# Patient Record
Sex: Female | Born: 1935 | Race: White | Hispanic: No | Marital: Single | Smoking: Former smoker
Health system: Southern US, Community
[De-identification: ages and names within clinical notes are randomized; demographics above are authoritative.]

## PROBLEM LIST (undated history)

## (undated) DIAGNOSIS — N390 Urinary tract infection, site not specified: Secondary | ICD-10-CM

## (undated) DIAGNOSIS — C3490 Malignant neoplasm of unspecified part of unspecified bronchus or lung: Secondary | ICD-10-CM

## (undated) DIAGNOSIS — J449 Chronic obstructive pulmonary disease, unspecified: Secondary | ICD-10-CM

## (undated) HISTORY — PX: BLADDER SUSPENSION: SHX72

## (undated) HISTORY — PX: LUNG REMOVAL, PARTIAL: SHX233

## (undated) HISTORY — PX: ABDOMINAL HYSTERECTOMY: SHX81

## (undated) HISTORY — PX: APPENDECTOMY: SHX54

## (undated) HISTORY — PX: FRACTURE SURGERY: SHX138

## (undated) HISTORY — PX: FEMUR FRACTURE SURGERY: SHX633

## (undated) HISTORY — PX: CHOLECYSTECTOMY: SHX55

---

## 2017-09-01 ENCOUNTER — Observation Stay (HOSPITAL_COMMUNITY)
Admission: EM | Admit: 2017-09-01 | Discharge: 2017-09-03 | Disposition: A | Discharge status: Home / Self Care | Payer: Medicare Other | Attending: Internal Medicine | Admitting: Internal Medicine

## 2017-09-01 ENCOUNTER — Encounter (HOSPITAL_COMMUNITY): Payer: Self-pay | Admitting: Emergency Medicine

## 2017-09-01 ENCOUNTER — Other Ambulatory Visit: Payer: Self-pay

## 2017-09-01 DIAGNOSIS — R51 Headache: Secondary | ICD-10-CM | POA: Insufficient documentation

## 2017-09-01 DIAGNOSIS — R112 Nausea with vomiting, unspecified: Secondary | ICD-10-CM | POA: Diagnosis not present

## 2017-09-01 DIAGNOSIS — Z7951 Long term (current) use of inhaled steroids: Secondary | ICD-10-CM | POA: Insufficient documentation

## 2017-09-01 DIAGNOSIS — Z79899 Other long term (current) drug therapy: Secondary | ICD-10-CM | POA: Diagnosis not present

## 2017-09-01 DIAGNOSIS — R519 Headache, unspecified: Secondary | ICD-10-CM

## 2017-09-01 DIAGNOSIS — E8770 Fluid overload, unspecified: Secondary | ICD-10-CM | POA: Diagnosis not present

## 2017-09-01 DIAGNOSIS — Z85118 Personal history of other malignant neoplasm of bronchus and lung: Secondary | ICD-10-CM

## 2017-09-01 DIAGNOSIS — J449 Chronic obstructive pulmonary disease, unspecified: Secondary | ICD-10-CM | POA: Diagnosis not present

## 2017-09-01 DIAGNOSIS — Z902 Acquired absence of lung [part of]: Secondary | ICD-10-CM | POA: Insufficient documentation

## 2017-09-01 DIAGNOSIS — K219 Gastro-esophageal reflux disease without esophagitis: Secondary | ICD-10-CM | POA: Diagnosis not present

## 2017-09-01 DIAGNOSIS — Z87891 Personal history of nicotine dependence: Secondary | ICD-10-CM | POA: Diagnosis not present

## 2017-09-01 DIAGNOSIS — D72829 Elevated white blood cell count, unspecified: Secondary | ICD-10-CM | POA: Diagnosis not present

## 2017-09-01 DIAGNOSIS — R1110 Vomiting, unspecified: Secondary | ICD-10-CM | POA: Diagnosis present

## 2017-09-01 DIAGNOSIS — R0902 Hypoxemia: Secondary | ICD-10-CM | POA: Insufficient documentation

## 2017-09-01 DIAGNOSIS — O2940 Spinal and epidural anesthesia induced headache during pregnancy, unspecified trimester: Secondary | ICD-10-CM | POA: Diagnosis present

## 2017-09-01 HISTORY — DX: Malignant neoplasm of unspecified part of unspecified bronchus or lung: C34.90

## 2017-09-01 HISTORY — DX: Chronic obstructive pulmonary disease, unspecified: J44.9

## 2017-09-01 HISTORY — DX: Urinary tract infection, site not specified: N39.0

## 2017-09-01 NOTE — ED Triage Notes (Signed)
Pt states she started vomiting around 1845 and has not been able to stop  Pt is dry heaving in triage nonstop   Pt states she had chills earlier today and is c/o headache  Pt states her stomach hurts for heaving

## 2017-09-02 ENCOUNTER — Emergency Department (HOSPITAL_COMMUNITY): Payer: Medicare Other

## 2017-09-02 ENCOUNTER — Observation Stay (HOSPITAL_COMMUNITY): Payer: Medicare Other

## 2017-09-02 DIAGNOSIS — R112 Nausea with vomiting, unspecified: Principal | ICD-10-CM

## 2017-09-02 DIAGNOSIS — K219 Gastro-esophageal reflux disease without esophagitis: Secondary | ICD-10-CM

## 2017-09-02 DIAGNOSIS — Z85118 Personal history of other malignant neoplasm of bronchus and lung: Secondary | ICD-10-CM | POA: Diagnosis not present

## 2017-09-02 DIAGNOSIS — D72829 Elevated white blood cell count, unspecified: Secondary | ICD-10-CM | POA: Diagnosis not present

## 2017-09-02 DIAGNOSIS — J449 Chronic obstructive pulmonary disease, unspecified: Secondary | ICD-10-CM | POA: Diagnosis present

## 2017-09-02 DIAGNOSIS — R51 Headache: Secondary | ICD-10-CM

## 2017-09-02 DIAGNOSIS — O2940 Spinal and epidural anesthesia induced headache during pregnancy, unspecified trimester: Secondary | ICD-10-CM | POA: Diagnosis present

## 2017-09-02 LAB — URINALYSIS, COMPLETE (UACMP) WITH MICROSCOPIC
BILIRUBIN URINE: NEGATIVE
Glucose, UA: NEGATIVE mg/dL
Hgb urine dipstick: NEGATIVE
KETONES UR: 5 mg/dL — AB
Nitrite: NEGATIVE
Protein, ur: NEGATIVE mg/dL
Specific Gravity, Urine: 1.018 (ref 1.005–1.030)
pH: 8 (ref 5.0–8.0)

## 2017-09-02 LAB — COMPREHENSIVE METABOLIC PANEL
ALBUMIN: 3.9 g/dL (ref 3.5–5.0)
ALK PHOS: 110 U/L (ref 38–126)
ALT: 12 U/L — AB (ref 14–54)
AST: 21 U/L (ref 15–41)
Anion gap: 11 (ref 5–15)
BUN: 12 mg/dL (ref 6–20)
CALCIUM: 9.1 mg/dL (ref 8.9–10.3)
CO2: 21 mmol/L — AB (ref 22–32)
Chloride: 106 mmol/L (ref 101–111)
Creatinine, Ser: 0.62 mg/dL (ref 0.44–1.00)
GFR calc non Af Amer: >60 mL/min (ref >60)
GLUCOSE: 152 mg/dL — AB (ref 65–99)
Potassium: 3.5 mmol/L (ref 3.5–5.1)
SODIUM: 138 mmol/L (ref 135–145)
Total Bilirubin: 0.6 mg/dL (ref 0.3–1.2)
Total Protein: 7.6 g/dL (ref 6.5–8.1)

## 2017-09-02 LAB — CBC WITH DIFFERENTIAL/PLATELET
Basophils Absolute: 0 10*3/uL (ref 0.0–0.1)
Basophils Relative: 0 %
EOS ABS: 0 10*3/uL (ref 0.0–0.7)
Eosinophils Relative: 0 %
HCT: 38.4 % (ref 36.0–46.0)
HEMOGLOBIN: 12.3 g/dL (ref 12.0–15.0)
LYMPHS ABS: 1 10*3/uL (ref 0.7–4.0)
Lymphocytes Relative: 4 %
MCH: 25.8 pg — AB (ref 26.0–34.0)
MCHC: 32 g/dL (ref 30.0–36.0)
MCV: 80.5 fL (ref 78.0–100.0)
MONO ABS: 1.4 10*3/uL — AB (ref 0.1–1.0)
MONOS PCT: 6 %
NEUTROS PCT: 90 %
Neutro Abs: 19.8 10*3/uL — ABNORMAL HIGH (ref 1.7–7.7)
Platelets: 297 10*3/uL (ref 150–400)
RBC: 4.77 MIL/uL (ref 3.87–5.11)
RDW: 15.5 % (ref 11.5–15.5)
WBC: 22.2 10*3/uL — ABNORMAL HIGH (ref 4.0–10.5)

## 2017-09-02 LAB — TROPONIN I
Troponin I: <0.03 ng/mL (ref <0.03)
Troponin I: <0.03 ng/mL (ref <0.03)

## 2017-09-02 LAB — LIPASE, BLOOD: LIPASE: 19 U/L (ref 11–51)

## 2017-09-02 LAB — LACTIC ACID, PLASMA: LACTIC ACID, VENOUS: 1.1 mmol/L (ref 0.5–1.9)

## 2017-09-02 MED ORDER — SODIUM CHLORIDE 0.9 % IV SOLN
INTRAVENOUS | Status: DC
  Administered 2017-09-02: 12:00:00 via INTRAVENOUS

## 2017-09-02 MED ORDER — ONDANSETRON HCL 4 MG PO TABS: 4.0000 mg | ORAL_TABLET | Freq: Four times a day (QID) | ORAL | Status: DC | PRN

## 2017-09-02 MED ORDER — MOMETASONE FURO-FORMOTEROL FUM 200-5 MCG/ACT IN AERO
2.0000 | INHALATION_SPRAY | Freq: Two times a day (BID) | RESPIRATORY_TRACT | Status: DC
  Administered 2017-09-02 – 2017-09-03 (×3): 2 via RESPIRATORY_TRACT
  Filled 2017-09-02 (×2): qty 8.8

## 2017-09-02 MED ORDER — PROMETHAZINE HCL 25 MG/ML IJ SOLN
12.5000 mg | Freq: Once | INTRAMUSCULAR | Status: AC
  Administered 2017-09-02: 12.5 mg via INTRAVENOUS
  Filled 2017-09-02: qty 1

## 2017-09-02 MED ORDER — TIOTROPIUM BROMIDE MONOHYDRATE 18 MCG IN CAPS
18.0000 ug | ORAL_CAPSULE | Freq: Every day | RESPIRATORY_TRACT | Status: DC
  Administered 2017-09-02 – 2017-09-03 (×2): 18 ug via RESPIRATORY_TRACT
  Filled 2017-09-02 (×2): qty 5

## 2017-09-02 MED ORDER — SODIUM CHLORIDE 0.9 % IV BOLUS
1000.0000 mL | Freq: Once | INTRAVENOUS | Status: AC
  Administered 2017-09-02: 1000 mL via INTRAVENOUS

## 2017-09-02 MED ORDER — ACETAMINOPHEN 325 MG PO TABS
650.0000 mg | ORAL_TABLET | Freq: Four times a day (QID) | ORAL | Status: DC | PRN
  Administered 2017-09-02 – 2017-09-03 (×3): 650 mg via ORAL
  Filled 2017-09-02 (×3): qty 2

## 2017-09-02 MED ORDER — PANTOPRAZOLE SODIUM 40 MG PO TBEC
40.0000 mg | DELAYED_RELEASE_TABLET | Freq: Every day | ORAL | Status: DC
  Administered 2017-09-02 – 2017-09-03 (×2): 40 mg via ORAL
  Filled 2017-09-02 (×2): qty 1

## 2017-09-02 MED ORDER — KETOROLAC TROMETHAMINE 30 MG/ML IJ SOLN
30.0000 mg | Freq: Once | INTRAMUSCULAR | Status: AC
  Administered 2017-09-02: 30 mg via INTRAVENOUS
  Filled 2017-09-02: qty 1

## 2017-09-02 MED ORDER — SODIUM CHLORIDE 0.9 % IV BOLUS
500.0000 mL | Freq: Once | INTRAVENOUS | Status: AC
  Administered 2017-09-02: 500 mL via INTRAVENOUS

## 2017-09-02 MED ORDER — ENOXAPARIN SODIUM 40 MG/0.4ML ~~LOC~~ SOLN
40.0000 mg | SUBCUTANEOUS | Status: DC
  Administered 2017-09-02: 40 mg via SUBCUTANEOUS
  Filled 2017-09-02: qty 0.4

## 2017-09-02 MED ORDER — SODIUM CHLORIDE 0.9% FLUSH
3.0000 mL | Freq: Two times a day (BID) | INTRAVENOUS | Status: DC
  Administered 2017-09-02 – 2017-09-03 (×2): 3 mL via INTRAVENOUS

## 2017-09-02 MED ORDER — ONDANSETRON HCL 4 MG/2ML IJ SOLN
4.0000 mg | Freq: Once | INTRAMUSCULAR | Status: AC
  Administered 2017-09-02: 4 mg via INTRAVENOUS
  Filled 2017-09-02: qty 2

## 2017-09-02 MED ORDER — MONTELUKAST SODIUM 10 MG PO TABS
10.0000 mg | ORAL_TABLET | Freq: Every day | ORAL | Status: DC
  Administered 2017-09-02: 10 mg via ORAL
  Filled 2017-09-02: qty 1

## 2017-09-02 MED ORDER — ALBUTEROL SULFATE (2.5 MG/3ML) 0.083% IN NEBU: 2.5000 mg | INHALATION_SOLUTION | RESPIRATORY_TRACT | Status: DC | PRN

## 2017-09-02 MED ORDER — ACETAMINOPHEN 650 MG RE SUPP: 650.0000 mg | Freq: Four times a day (QID) | RECTAL | Status: DC | PRN

## 2017-09-02 MED ORDER — FLUTICASONE PROPIONATE 50 MCG/ACT NA SUSP
1.0000 | Freq: Every day | NASAL | Status: DC
Start: 2017-09-02 — End: 2017-09-03
  Administered 2017-09-02: 1 via NASAL
  Filled 2017-09-02: qty 16

## 2017-09-02 MED ORDER — ONDANSETRON HCL 4 MG/2ML IJ SOLN: 4.0000 mg | Freq: Four times a day (QID) | INTRAMUSCULAR | Status: DC | PRN

## 2017-09-02 NOTE — ED Notes (Signed)
ED TO INPATIENT HANDOFF REPORT  Name/Age/Gender Teresa Fuller 84 y.o. female  Code Status    Code Status Orders  (From admission, onward)        Start     Ordered   09/02/17 0510  Full code  Continuous     09/02/17 0514    Code Status History    This patient has a current code status but no historical code status.      Home/SNF/Other Home  Chief Complaint Emesis  Level of Care/Admitting Diagnosis ED Disposition    ED Disposition Condition Comment   Admit  Hospital Area: [facility] [[id number]]  Level of Care: Telemetry [5]  Admit to tele based on following criteria: Complex arrhythmia (Bradycardia/Tachycardia)  Diagnosis: Nausea and vomiting [846659]  Admitting Physician: Norval Morton [[id number]]  Attending Physician: Norval Morton [[id number]]  PT Class (Do Not Modify): Observation [104]  PT Acc Code (Do Not Modify): Observation [10022]       Medical History Past Medical History:  Diagnosis Date  . COPD (chronic obstructive pulmonary disease) ([place])   . Lung cancer ([place])   . Recurrent UTI     Allergies Allergies  Allergen Reactions  . Other     Opiates- cause n/v    IV Location/Drains/Wounds Patient Lines/Drains/Airways Status   Active Line/Drains/Airways    Name:   Placement date:   Placement time:   Site:   Days:   Peripheral IV 09/02/17 Left Antecubital   09/02/17    0038    Antecubital   less than 1          Labs/Imaging Results for orders placed or performed during the hospital encounter of 09/01/17 (from the past 48 hour(s))  Comprehensive metabolic panel     Status: Abnormal   Collection Time: 09/02/17 12:45 AM  Result Value Ref Range   Sodium 138 135 - 145 mmol/L   Potassium 3.5 3.5 - 5.1 mmol/L   Chloride 106 101 - 111 mmol/L   CO2 21 (L) 22 - 32 mmol/L   Glucose, Bld 152 (H) 65 - 99 mg/dL   BUN 12 6 - 20 mg/dL   Creatinine, Ser 0.62 0.44 - 1.00 mg/dL   Calcium 9.1 8.9 - 10.3 mg/dL   Total Protein 7.6 6.5 -  8.1 g/dL   Albumin 3.9 3.5 - 5.0 g/dL   AST 21 15 - 41 U/L   ALT 12 (L) 14 - 54 U/L   Alkaline Phosphatase 110 38 - 126 U/L   Total Bilirubin 0.6 0.3 - 1.2 mg/dL   GFR calc non Af Amer >60 >60 mL/min   GFR calc Af Amer >60 >60 mL/min    Comment: (NOTE) The eGFR has been calculated using the CKD EPI equation. This calculation has not been validated in all clinical situations. eGFR's persistently <60 mL/min signify possible Chronic Kidney Disease.    Anion gap 11 5 - 15    Comment: Performed at [facility], [place] [address] [place], [place] [address]  CBC with Differential     Status: Abnormal   Collection Time: 09/02/17 12:45 AM  Result Value Ref Range   WBC 22.2 (H) 4.0 - 10.5 K/uL   RBC 4.77 3.87 - 5.11 MIL/uL   Hemoglobin 12.3 12.0 - 15.0 g/dL   HCT 38.4 36.0 - 46.0 %   MCV 80.5 78.0 - 100.0 fL   MCH 25.8 (L) 26.0 - 34.0 pg   MCHC 32.0 30.0 - 36.0 g/dL  RDW 15.5 11.5 - 15.5 %   Platelets 297 150 - 400 K/uL   Neutrophils Relative % 90 %   Neutro Abs 19.8 (H) 1.7 - 7.7 K/uL   Lymphocytes Relative 4 %   Lymphs Abs 1.0 0.7 - 4.0 K/uL   Monocytes Relative 6 %   Monocytes Absolute 1.4 (H) 0.1 - 1.0 K/uL   Eosinophils Relative 0 %   Eosinophils Absolute 0.0 0.0 - 0.7 K/uL   Basophils Relative 0 %   Basophils Absolute 0.0 0.0 - 0.1 K/uL    Comment: Performed at [facility], [place] [address] [place], [place] [address]  Troponin I     Status: None   Collection Time: 09/02/17 12:45 AM  Result Value Ref Range   Troponin I <0.03 <0.03 ng/mL    Comment: Performed at [facility], [place] [address] [place], [place] [address]  Urinalysis, Complete w Microscopic     Status: Abnormal   Collection Time: 09/02/17  2:57 AM  Result Value Ref Range   Color, Urine YELLOW YELLOW   APPearance CLEAR CLEAR   Specific Gravity, Urine 1.018 1.005 - 1.030   pH 8.0 5.0 - 8.0   Glucose, UA NEGATIVE NEGATIVE mg/dL   Hgb urine dipstick  NEGATIVE NEGATIVE   Bilirubin Urine NEGATIVE NEGATIVE   Ketones, ur 5 (A) NEGATIVE mg/dL   Protein, ur NEGATIVE NEGATIVE mg/dL   Nitrite NEGATIVE NEGATIVE   Leukocytes, UA TRACE (A) NEGATIVE   RBC / HPF 0-5 0 - 5 RBC/hpf   WBC, UA 0-5 0 - 5 WBC/hpf   Bacteria, UA RARE (A) NONE SEEN   Squamous Epithelial / LPF 0-5 0 - 5   Mucus PRESENT    Hyaline Casts, UA PRESENT     Comment: Performed at [facility], [place] [address] [place], [place] [address]   Ct Head Wo Contrast  Result Date: 09/02/2017 CLINICAL DATA:  Headache EXAM: CT HEAD WITHOUT CONTRAST TECHNIQUE: Contiguous axial images were obtained from the base of the skull through the vertex without intravenous contrast. COMPARISON:  None. FINDINGS: Brain: No acute territorial infarction, hemorrhage or intracranial mass. Moderate atrophy. Minimal small vessel ischemic changes of the white matter. Nonenlarged ventricles Vascular: No hyperdense vessels.  Carotid vascular calcification Skull: Normal. Negative for fracture or focal lesion. Sinuses/Orbits: Mucosal thickening in the ethmoid sinuses. No acute orbital abnormality. Other: None IMPRESSION: 1. No CT evidence for acute intracranial abnormality. 2. Atrophy and minimal small vessel ischemic changes of the white matter Electronically Signed   By: Donavan Foil M.D.   On: 09/02/2017 02:14   Dg Abd Acute W/chest  Result Date: 09/02/2017 CLINICAL DATA:  Nausea and vomiting.  Mid abdominal pain.  Weakness. EXAM: DG ABDOMEN ACUTE W/ 1V CHEST COMPARISON:  None. FINDINGS: Mild cardiomegaly. Tortuous thoracic aorta. Vascular congestion with small bilateral pleural effusions, left greater right. Bibasilar opacities likely atelectasis. No free intra-abdominal air. No bowel dilatation to suggest obstruction. Formed stool in the ascending and rectosigmoid colon. No abnormal rectal distention. Cholecystectomy clips in the right upper quadrant. Right hip arthroplasty is partially included.  The bones are under mineralized. IMPRESSION: 1. No bowel obstruction or free air. 2. Cardiomegaly with small bilateral pleural effusions and vascular congestion, suggesting volume overload. Bibasilar opacities favor atelectasis. Electronically Signed   By: Jeb Levering M.D.   On: 09/02/2017 06:29    Pending Labs Unresulted Labs (From admission, onward)   Start     Ordered   09/02/17 [id number]  Troponin I  Once,   R     09/02/17 0520   09/02/17 0425  Lipase, blood  Add-on,   R     09/02/17 0424   09/02/17 0421  Lactic acid, plasma  Add-on,   R     09/02/17 0420      Vitals/Pain Today's Vitals   09/01/17 2252 09/02/17 0103 09/02/17 0339 09/02/17 0529  BP:  129/82 117/80 102/73  Pulse:  88 88 73  Resp:  (!) 27 17 (!) 21  Temp:      TempSrc:      SpO2:  94% 97% 96%  PainSc: 8        Isolation Precautions No active isolations  Medications Medications  montelukast (SINGULAIR) tablet 10 mg (has no administration in time range)  pantoprazole (PROTONIX) EC tablet 40 mg (has no administration in time range)  tiotropium (SPIRIVA) inhalation capsule 18 mcg (has no administration in time range)  fluticasone (FLONASE) 50 MCG/ACT nasal spray 1 spray (has no administration in time range)  mometasone-formoterol (DULERA) 200-5 MCG/ACT inhaler 2 puff (has no administration in time range)  enoxaparin (LOVENOX) injection 40 mg (has no administration in time range)  sodium chloride flush (NS) 0.9 % injection 3 mL (has no administration in time range)  albuterol (PROVENTIL) (2.5 MG/3ML) 0.083% nebulizer solution 2.5 mg (has no administration in time range)  ondansetron (ZOFRAN) tablet 4 mg (has no administration in time range)    Or  ondansetron (ZOFRAN) injection 4 mg (has no administration in time range)  acetaminophen (TYLENOL) tablet 650 mg (has no administration in time range)    Or  acetaminophen (TYLENOL) suppository 650 mg (has no administration in time range)  ondansetron (ZOFRAN)  injection 4 mg (4 mg Intravenous Given 09/02/17 0040)  sodium chloride 0.9 % bolus 1,000 mL (0 mLs Intravenous Stopped 09/02/17 0225)  ketorolac (TORADOL) 30 MG/ML injection 30 mg (30 mg Intravenous Given 09/02/17 0255)  promethazine (PHENERGAN) injection 12.5 mg (12.5 mg Intravenous Given 09/02/17 0305)  sodium chloride 0.9 % bolus 500 mL (0 mLs Intravenous Stopped 09/02/17 0333)    Mobility walks

## 2017-09-02 NOTE — H&P (Addendum)
History and Physical    Teresa Fuller TTS:[id number] DOB: [birth year] DOA: 09/01/2017  Referring MD/NP/PA: Dr. Stark Jock PCP: System, Pcp Not In  Patient coming from: Home  Chief Complaint: Nausea, vomiting, and headache  I have personally briefly reviewed patient's old medical records in [place]   HPI: Teresa Fuller is a 84 y.o. female with medical history significant of COPD, lung cancer s/p resection, UTIs, osteoarthritis, and pseudogout; who presents with complaints of headache, nausea, and vomiting.  Symptoms started acutely yesterday night around 7 PM.  She recalls having a fried fish sandwick sometime around lunch.  She reports having several episodes of nonbloody emesis to the point which patient reported feeling lightheaded/dizzy.  Reports some discomfort from vomiting.  Denies any feeling of vertigo, focal weakness, change in vision, fever, recent steroid use, medication changes, abdominal pain, diarrhea, or dysuria.  She chronically has a cough she reports is unchanged and she has had some leg numbness, but equates it to previous surgery of her right leg after suffering a spiral fracture back in January.  She is visiting from [place] and came down by her 2 days ago for family member graduation.  She reports that they took frequent stops, but may have been running for longer than 4 hours during the last leg of the trip.   ED Course: Upon admission into the emergency department patient was seen to be afebrile, pulse 88-97, respirations 17-27, blood pressure 117/80-131/92, and O2 saturation 94 to 97%.  Lab work revealed WBC 22.2.  Patient CMP and troponin levels were noted to be relatively within limits.  Urinalysis did not show clear signs of infection.  CT scan of the brain showed no acute abnormalities to cause symptoms.  Patient was given 1.5 L of normal saline IV fluids, Phenergan, Zofran, and 30 mg of ketorolac without relief of symptoms.  TRH called to admit.   Review of  Systems  Constitutional: Positive for malaise/fatigue. Negative for weight loss.  HENT: Negative for ear discharge and nosebleeds.   Eyes: Negative for pain and discharge.  Respiratory: Positive for cough (Chronic). Negative for shortness of breath.   Cardiovascular: Negative for chest pain and leg swelling.  Gastrointestinal: Positive for nausea and vomiting. Negative for abdominal pain.  Genitourinary: Negative for dysuria and frequency.  Musculoskeletal: Positive for myalgias. Negative for falls.  Skin: Negative for itching and rash.  Neurological: Positive for dizziness and headaches. Negative for loss of consciousness.  Psychiatric/Behavioral: Negative for memory loss and substance abuse.    Past Medical History:  Diagnosis Date  . COPD (chronic obstructive pulmonary disease) ([place])   . Lung cancer ([place])   . Recurrent UTI     Past Surgical History:  Procedure Laterality Date  . ABDOMINAL HYSTERECTOMY    . APPENDECTOMY    . BLADDER SUSPENSION    . CHOLECYSTECTOMY    . FEMUR FRACTURE SURGERY    . FRACTURE SURGERY    . LUNG REMOVAL, PARTIAL       reports that she has quit smoking. She has never used smokeless tobacco. She reports that she does not drink alcohol or use drugs.  Allergies  Allergen Reactions  . Other     Opiates- cause n/v    No family history on file.  Prior to Admission medications   Medication Sig Start Date End Date Taking? Authorizing Provider  acetaminophen (TYLENOL) 650 MG CR tablet Take 650 mg by mouth every 8 (eight) hours as needed for pain.   Yes [provider]  DULERA 200-5 MCG/ACT AERO Inhale 2 puffs into the lungs 2 (two) times daily.  08/29/17  Yes [provider]  fluticasone (FLONASE) 50 MCG/ACT nasal spray Place 1 spray into both nostrils daily.   Yes [provider]  montelukast (SINGULAIR) 10 MG tablet Take 10 mg by mouth at bedtime. 07/05/17  Yes [provider]  pantoprazole (PROTONIX) 40 MG tablet  Take 40 mg by mouth daily. 06/05/17  Yes [provider]  SPIRIVA HANDIHALER 18 MCG inhalation capsule Place 1 puff into inhaler and inhale daily.  08/29/17  Yes [provider]  Vitamin D, Ergocalciferol, (DRISDOL) 50000 units CAPS capsule Take 50,000 Units by mouth once a week.  07/26/17  Yes [provider]    Physical Exam:  Constitutional: Elderly female who appears unable to get comfortable Vitals:   09/01/17 2247 09/02/17 0103 09/02/17 0339  BP: (!) 131/92 129/82 117/80  Pulse: 97 88 88  Resp: 20 (!) 27 17  Temp: 98.9 F (37.2 C)    TempSrc: Oral    SpO2: 95% 94% 97%   Eyes: PERRL, lids and conjunctivae normal ENMT: Mucous membranes are dry. Posterior pharynx clear of any exudate or lesions.   Neck: normal, supple, no masses, no thyromegaly Respiratory: Coarse breath sounds noted, but no significant wheezes or crackles appreciated.  Patient on 2 L nasal cannula oxygen currently. Cardiovascular: Regular rate and rhythm, no murmurs / rubs / gallops. No extremity edema. 2+ pedal pulses. No carotid bruits.  Abdomen: no tenderness, no masses palpated. No hepatosplenomegaly. Bowel sounds positive.  Musculoskeletal: no clubbing / cyanosis. No joint deformity upper and lower extremities. Good ROM, no contractures. Normal muscle tone.  Skin: no rashes, lesions, ulcers. No induration Neurologic: CN 2-12 grossly intact. Sensation intact, DTR normal. Strength 5/5 in all 4.  Psychiatric: Normal judgment and insight. Alert and oriented x 3. Normal mood.     Labs on Admission: I have personally reviewed following labs and imaging studies  CBC: Recent Labs  Lab 09/02/17 0045  WBC 22.2*  NEUTROABS 19.8*  HGB 12.3  HCT 38.4  MCV 80.5  PLT 073   Basic Metabolic Panel: Recent Labs  Lab 09/02/17 0045  NA 138  K 3.5  CL 106  CO2 21*  GLUCOSE 152*  BUN 12  CREATININE 0.62  CALCIUM 9.1   GFR: CrCl cannot be calculated (Unknown ideal weight.). Liver  Function Tests: Recent Labs  Lab 09/02/17 0045  AST 21  ALT 12*  ALKPHOS 110  BILITOT 0.6  PROT 7.6  ALBUMIN 3.9   No results for input(s): LIPASE, AMYLASE in the last 168 hours. No results for input(s): AMMONIA in the last 168 hours. Coagulation Profile: No results for input(s): INR, PROTIME in the last 168 hours. Cardiac Enzymes: Recent Labs  Lab 09/02/17 0045  TROPONINI <0.03   BNP (last 3 results) No results for input(s): PROBNP in the last 8760 hours. HbA1C: No results for input(s): HGBA1C in the last 72 hours. CBG: No results for input(s): GLUCAP in the last 168 hours. Lipid Profile: No results for input(s): CHOL, HDL, LDLCALC, TRIG, CHOLHDL, LDLDIRECT in the last 72 hours. Thyroid Function Tests: No results for input(s): TSH, T4TOTAL, FREET4, T3FREE, THYROIDAB in the last 72 hours. Anemia Panel: No results for input(s): VITAMINB12, FOLATE, FERRITIN, TIBC, IRON, RETICCTPCT in the last 72 hours. Urine analysis:    Component Value Date/Time   COLORURINE YELLOW 09/02/2017 [place] 09/02/2017 0257   LABSPEC 1.018  09/02/2017 0257   PHURINE 8.0 09/02/2017 [place] 09/02/2017 0257   HGBUR NEGATIVE 09/02/2017 0257   BILIRUBINUR NEGATIVE 09/02/2017 0257   KETONESUR 5 (A) 09/02/2017 0257   PROTEINUR NEGATIVE 09/02/2017 0257   NITRITE NEGATIVE 09/02/2017 0257   LEUKOCYTESUR TRACE (A) 09/02/2017 0257   Sepsis Labs: No results found for this or any previous visit (from the past 240 hour(s)).   Radiological Exams on Admission: Ct Head Wo Contrast  Result Date: 09/02/2017 CLINICAL DATA:  Headache EXAM: CT HEAD WITHOUT CONTRAST TECHNIQUE: Contiguous axial images were obtained from the base of the skull through the vertex without intravenous contrast. COMPARISON:  None. FINDINGS: Brain: No acute territorial infarction, hemorrhage or intracranial mass. Moderate atrophy. Minimal small vessel ischemic changes of the white matter. Nonenlarged  ventricles Vascular: No hyperdense vessels.  Carotid vascular calcification Skull: Normal. Negative for fracture or focal lesion. Sinuses/Orbits: Mucosal thickening in the ethmoid sinuses. No acute orbital abnormality. Other: None IMPRESSION: 1. No CT evidence for acute intracranial abnormality. 2. Atrophy and minimal small vessel ischemic changes of the white matter Electronically Signed   By: Donavan Foil M.D.   On: 09/02/2017 02:14    EKG: Independently reviewed.  Sinus rhythm 85 bpm with left anterior fascicular block no previous EKGs to compare.  Assessment/Plan Nausea and vomiting: Acute.  Patient presents with acute onset headache with nausea and vomiting symptoms.  CT imaging showed no acute abnormality cause symptoms and blood pressures were noted to be relatively within normal limits.  Question possibility of gastroenteritis.  Patient received 1 L of normal saline IV fluids antiemetics with some improvement of symptoms. - Admit to telemetry bed - Gentle IV fluids - Check acute abdominal series - Add on lipase - Recheck troponin  - Antiemetics as needed  Headache: Acute.  Unclear cause of symptoms.  - Continue symptomatic treatment  Leukocytosis: Acute.  WBC elevated at 22.2 on admission.  Unclear cause at this time as urinalysis does not show any clear signs of infection.  - Add on lactic acid level  - Continue to monitor  COPD, without acute exacerbation.  At baseline patient reports not requiring oxygen at baseline. - Continue Dulera, Spiriva  History of lung cancer status post resection approximately 5 years ago.  GERD - Continue Protonix  DVT prophylaxis: Lovenox Code Status: Full Family Communication: Discussed plan of care with the patient family present at bedside Disposition Plan: Discharge home 1 to 2 days Consults called: None Admission status: Observation  Norval Morton MD [facility] Pager [phone]   If 7PM-7AM, please contact  night-coverage [email or web address] Password [email or web address]  09/02/2017, 4:03 AM

## 2017-09-02 NOTE — Care Management Obs Status (Signed)
[place] NOTIFICATION   Patient Details  Name: Teresa Fuller MRN: [id number] Date of Birth: 02/19/1933   Medicare Observation Status Notification Given:  Yes    Leeroy Cha, RN 09/02/2017, 9:57 AM

## 2017-09-02 NOTE — ED Provider Notes (Signed)
[facility] DEPT Provider Note   CSN: [id number] Arrival date & time: 09/01/17  2240     History   Chief Complaint Chief Complaint  Patient presents with  . Emesis    HPI Teresa Fuller is a 84 y.o. female.  Patient is an 84 year old female with history of COPD, Lung cancer presenting with complaints of nausea and vomiting.  This started abruptly approximately 6 PM.  She reports a headache and feeling dizzy.  She denies any abdominal pain, diarrhea, or fever.  Her symptoms seem to be worse when she attempts to move and relieved somewhat with rest.  The history is provided by the patient.  Emesis   This is a new problem. Episode onset: 6 PM. The problem occurs continuously. The problem has been rapidly worsening. The emesis has an appearance of stomach contents. There has been no fever. Pertinent negatives include no abdominal pain, no chills, no diarrhea and no fever.    Past Medical History:  Diagnosis Date  . COPD (chronic obstructive pulmonary disease) ([place])   . Lung cancer ([place])   . Recurrent UTI     There are no active problems to display for this patient.   Past Surgical History:  Procedure Laterality Date  . ABDOMINAL HYSTERECTOMY    . APPENDECTOMY    . BLADDER SUSPENSION    . CHOLECYSTECTOMY    . FEMUR FRACTURE SURGERY    . FRACTURE SURGERY    . LUNG REMOVAL, PARTIAL       OB History   None      Home Medications    Prior to Admission medications   Medication Sig Start Date End Date Taking? Authorizing Provider  acetaminophen (TYLENOL) 650 MG CR tablet Take 650 mg by mouth every 8 (eight) hours as needed for pain.   Yes [provider]  DULERA 200-5 MCG/ACT AERO Inhale 2 puffs into the lungs 2 (two) times daily.  08/29/17  Yes [provider]  fluticasone (FLONASE) 50 MCG/ACT nasal spray Place 1 spray into both nostrils daily.   Yes [provider]  montelukast (SINGULAIR) 10 MG tablet Take 10 mg  by mouth at bedtime. 07/05/17  Yes [provider]  pantoprazole (PROTONIX) 40 MG tablet Take 40 mg by mouth daily. 06/05/17  Yes [provider]  SPIRIVA HANDIHALER 18 MCG inhalation capsule Place 1 puff into inhaler and inhale daily.  08/29/17  Yes [provider]  Vitamin D, Ergocalciferol, (DRISDOL) 50000 units CAPS capsule Take 50,000 Units by mouth once a week.  07/26/17  Yes [provider]    Family History No family history on file.  Social History Social History   Tobacco Use  . Smoking status: Former Research scientist (life sciences)  . Smokeless tobacco: Never Used  Substance Use Topics  . Alcohol use: Never    Frequency: Never  . Drug use: Never     Allergies   Other   Review of Systems Review of Systems  Constitutional: Negative for chills and fever.  Gastrointestinal: Positive for vomiting. Negative for abdominal pain and diarrhea.  All other systems reviewed and are negative.    Physical Exam Updated Vital Signs BP (!) 131/92 (BP Location: Right Arm)   Pulse 97   Temp 98.9 F (37.2 C) (Oral)   Resp 20   SpO2 95%   Physical Exam  Constitutional: She is oriented to person, place, and time. She appears well-developed and well-nourished. No distress.  HENT:  Head: Normocephalic and atraumatic.  Eyes: Pupils are equal, round, and reactive to light. EOM are normal.  Neck: Normal range of motion. Neck supple.  Cardiovascular: Normal rate and regular rhythm. Exam reveals no gallop and no friction rub.  No murmur heard. Pulmonary/Chest: Effort normal and breath sounds normal. No respiratory distress. She has no wheezes.  Abdominal: Soft. Bowel sounds are normal. She exhibits no distension. There is no tenderness.  Musculoskeletal: Normal range of motion.  Neurological: She is alert and oriented to person, place, and time. No cranial nerve deficit. She exhibits normal muscle tone. Coordination normal.  Skin: Skin is warm and dry. She is not  diaphoretic.  Nursing note and vitals reviewed.    ED Treatments / Results  Labs (all labs ordered are listed, but only abnormal results are displayed) Labs Reviewed  COMPREHENSIVE METABOLIC PANEL  CBC WITH DIFFERENTIAL/PLATELET  TROPONIN I    ED ECG REPORT   Date: 09/02/2017  Rate: 84  Rhythm: normal sinus rhythm  QRS Axis: left  Intervals: normal  ST/T Wave abnormalities: nonspecific T wave changes  Conduction Disutrbances:none  Narrative Interpretation:   Old EKG Reviewed: none available  I have personally reviewed the EKG tracing and agree with the computerized printout as noted.   Radiology No results found.  Procedures Procedures (including critical care time)  Medications Ordered in ED Medications  ondansetron (ZOFRAN) injection 4 mg (has no administration in time range)  sodium chloride 0.9 % bolus 1,000 mL (has no administration in time range)     Initial Impression / Assessment and Plan / ED Course  I have reviewed the triage vital signs and the nursing notes.  Pertinent labs & imaging results that were available during my care of the patient were reviewed by me and considered in my medical decision making (see chart for details).  Patient presents here with complaints of dizziness, headache, and nausea.  This started abruptly this evening.  She recently drove here from [place] to attend a grandchild's graduation.  Her initial presentation was most consistent with vertigo.  She was given Zofran and IV fluids, however this did not help.  She was also given Phenergan and Toradol with minimal relief.  Her head CT is negative and laboratory studies are otherwise unremarkable with the exception of an elevated Fuller count of 22,000.  I am uncertain as to the exact etiology of the of the leukocytosis, however as the patient is 84 years old I feel as though admission for observation is indicated.  I have spoken with Dr. Tamala Julian who agrees to admit.  Final  Clinical Impressions(s) / ED Diagnoses   Final diagnoses:  None    ED Discharge Orders    None       Veryl Speak, MD 09/02/17 [phone]

## 2017-09-02 NOTE — Progress Notes (Signed)
Patient ID: Teresa Fuller, female   DOB: [birth year], 84 y.o.   MRN: [id number] Patient was admitted early this morning for nausea, vomiting and headache with possibility of gastroenteritis.  She was started on IV fluids.  Patient seen and examined at bedside and plan of care discussed with her.  This morning's H&P was reviewed by myself.  Continue intravenous fluids.  Advance diet as tolerated.  Currently not nauseous with no headache.  Repeat a.m. labs.  Ambulate with assistance.  If patient improves, probable discharge tomorrow.

## 2017-09-03 DIAGNOSIS — R112 Nausea with vomiting, unspecified: Secondary | ICD-10-CM | POA: Diagnosis not present

## 2017-09-03 DIAGNOSIS — J449 Chronic obstructive pulmonary disease, unspecified: Secondary | ICD-10-CM

## 2017-09-03 DIAGNOSIS — D72829 Elevated white blood cell count, unspecified: Secondary | ICD-10-CM | POA: Diagnosis not present

## 2017-09-03 DIAGNOSIS — Z85118 Personal history of other malignant neoplasm of bronchus and lung: Secondary | ICD-10-CM | POA: Diagnosis not present

## 2017-09-03 LAB — COMPREHENSIVE METABOLIC PANEL
ALBUMIN: 2.8 g/dL — AB (ref 3.5–5.0)
ALT: 8 U/L — AB (ref 14–54)
AST: 14 U/L — AB (ref 15–41)
Alkaline Phosphatase: 83 U/L (ref 38–126)
Anion gap: 8 (ref 5–15)
BUN: 10 mg/dL (ref 6–20)
CALCIUM: 8.2 mg/dL — AB (ref 8.9–10.3)
CO2: 19 mmol/L — AB (ref 22–32)
CREATININE: 0.55 mg/dL (ref 0.44–1.00)
Chloride: 109 mmol/L (ref 101–111)
GFR calc Af Amer: >60 mL/min (ref >60)
GFR calc non Af Amer: >60 mL/min (ref >60)
GLUCOSE: 93 mg/dL (ref 65–99)
Potassium: 3.9 mmol/L (ref 3.5–5.1)
SODIUM: 136 mmol/L (ref 135–145)
TOTAL PROTEIN: 6.1 g/dL — AB (ref 6.5–8.1)
Total Bilirubin: 1.3 mg/dL — ABNORMAL HIGH (ref 0.3–1.2)

## 2017-09-03 LAB — CBC WITH DIFFERENTIAL/PLATELET
BASOS ABS: 0 10*3/uL (ref 0.0–0.1)
BASOS PCT: 0 %
EOS ABS: 0 10*3/uL (ref 0.0–0.7)
EOS PCT: 0 %
HCT: 33.5 % — ABNORMAL LOW (ref 36.0–46.0)
Hemoglobin: 10.5 g/dL — ABNORMAL LOW (ref 12.0–15.0)
LYMPHS PCT: 5 %
Lymphs Abs: 0.8 10*3/uL (ref 0.7–4.0)
MCH: 25.5 pg — ABNORMAL LOW (ref 26.0–34.0)
MCHC: 31.3 g/dL (ref 30.0–36.0)
MCV: 81.3 fL (ref 78.0–100.0)
Monocytes Absolute: 0.6 10*3/uL (ref 0.1–1.0)
Monocytes Relative: 4 %
Neutro Abs: 13.3 10*3/uL — ABNORMAL HIGH (ref 1.7–7.7)
Neutrophils Relative %: 91 %
PLATELETS: 234 10*3/uL (ref 150–400)
RBC: 4.12 MIL/uL (ref 3.87–5.11)
RDW: 15.8 % — AB (ref 11.5–15.5)
WBC: 14.7 10*3/uL — AB (ref 4.0–10.5)

## 2017-09-03 LAB — MAGNESIUM: Magnesium: 1.7 mg/dL (ref 1.7–2.4)

## 2017-09-03 MED ORDER — FUROSEMIDE 10 MG/ML IJ SOLN
40.0000 mg | Freq: Once | INTRAMUSCULAR | Status: AC
  Administered 2017-09-03: 40 mg via INTRAVENOUS
  Filled 2017-09-03: qty 4

## 2017-09-03 NOTE — Progress Notes (Signed)
Pt leaving this afternoon with her family. Pt is headed home to [facility] state. Pt alert, oriented, and without c/o. Discharge instructions given/explained with pt verbalizing understanding.  Pt aware to followup with PCP.

## 2017-09-03 NOTE — Discharge Summary (Signed)
Physician Discharge Summary  Teresa Fuller ZDG:[id number] DOB: 08/07/1932 DOA: 09/01/2017  PCP: System, Pcp Not In  Admit date: 09/01/2017 Discharge date: 09/03/2017  Admitted From: Home Disposition:  Home  Recommendations for Outpatient Follow-up:  1. Follow up with PCP in 1 week with CBC/BMP  Home Health: No Equipment/Devices: None  Discharge Condition: Stable  CODE STATUS: Full Diet recommendation: Heart Healthy   Brief/Interim Summary: 84 year old female with history of COPD, lung cancer status post resection, UTI, osteoarthritis and pseudogout presented with nausea, vomiting and headache.  Patient was found to have leukocytosis and admitted for probable gastroenteritis and started on intravenous fluids.  Patient's condition gradually improved.  Patient required some oxygen supplementation which improved after a dose of intravenous Lasix.  Currently she is on room air and wants to go home.  She will be discharged home with outpatient follow-up with her regular PCP in [place] in a week.  Discharge Diagnoses:  Principal Problem:   Nausea and vomiting Active Problems:   COPD (chronic obstructive pulmonary disease) (HCC)   History of lung cancer   Headache following intrapartum spinal anesthesia   Leukocytosis   GERD (gastroesophageal reflux disease)  Probable acute gastroenteritis causing nausea and vomiting -CT of the head was negative for any acute abnormality -Treated with intravenous fluids. -Much improved.  Currently tolerating diet  Hypoxia most likely secondary to fluid overload -Given 1 dose of intravenous Lasix.  Patient diuresed well. -Resolved.  Currently on room air.  Patient wants to be discharged home today.  Patient is to follow-up with her regular PCP within a week in [place].  Patient plans to travel with her daughter in car to [place] today.  Headache -Resolved.  Unclear cause  Leukocytosis -Probably reactive.  Improving.  Outpatient  follow-up  COPD without exacerbation -Continue Dulera and Spiriva.  Outpatient follow-up  History of lung cancer status post resection approximately 5 years ago - Outpatient follow-up  GERD -Continue Protonix  Discharge Instructions  Discharge Instructions    Call MD for:  difficulty breathing, headache or visual disturbances   Complete by:  As directed    Call MD for:  extreme fatigue   Complete by:  As directed    Call MD for:  hives   Complete by:  As directed    Call MD for:  persistant dizziness or light-headedness   Complete by:  As directed    Call MD for:  persistant nausea and vomiting   Complete by:  As directed    Call MD for:  severe uncontrolled pain   Complete by:  As directed    Call MD for:  temperature >100.4   Complete by:  As directed    Diet - low sodium heart healthy   Complete by:  As directed    Increase activity slowly   Complete by:  As directed      Allergies as of 09/03/2017      Reactions   Other    Opiates- cause n/v      Medication List    TAKE these medications   acetaminophen 650 MG CR tablet Commonly known as:  TYLENOL Take 650 mg by mouth every 8 (eight) hours as needed for pain.   DULERA 200-5 MCG/ACT Aero Generic drug:  mometasone-formoterol Inhale 2 puffs into the lungs 2 (two) times daily.   fluticasone 50 MCG/ACT nasal spray Commonly known as:  FLONASE Place 1 spray into both nostrils daily.   montelukast 10 MG tablet Commonly known as:  SINGULAIR Take 10 mg by mouth at bedtime.   pantoprazole 40 MG tablet Commonly known as:  PROTONIX Take 40 mg by mouth daily.   SPIRIVA HANDIHALER 18 MCG inhalation capsule Generic drug:  tiotropium Place 1 puff into inhaler and inhale daily.   Vitamin D (Ergocalciferol) 50000 units Caps capsule Commonly known as:  DRISDOL Take 50,000 Units by mouth once a week.      Follow-up Information    PCP. Schedule an appointment as soon as possible for a visit in 1 week(s).   Why:   with repeat cbc/bmp         Allergies  Allergen Reactions  . Other     Opiates- cause n/v    Consultations: None  Procedures/Studies: Ct Head Wo Contrast  Result Date: 09/02/2017 CLINICAL DATA:  Headache EXAM: CT HEAD WITHOUT CONTRAST TECHNIQUE: Contiguous axial images were obtained from the base of the skull through the vertex without intravenous contrast. COMPARISON:  None. FINDINGS: Brain: No acute territorial infarction, hemorrhage or intracranial mass. Moderate atrophy. Minimal small vessel ischemic changes of the white matter. Nonenlarged ventricles Vascular: No hyperdense vessels.  Carotid vascular calcification Skull: Normal. Negative for fracture or focal lesion. Sinuses/Orbits: Mucosal thickening in the ethmoid sinuses. No acute orbital abnormality. Other: None IMPRESSION: 1. No CT evidence for acute intracranial abnormality. 2. Atrophy and minimal small vessel ischemic changes of the white matter Electronically Signed   By: Donavan Foil M.D.   On: 09/02/2017 02:14   Dg Abd Acute W/chest  Result Date: 09/02/2017 CLINICAL DATA:  Nausea and vomiting.  Mid abdominal pain.  Weakness. EXAM: DG ABDOMEN ACUTE W/ 1V CHEST COMPARISON:  None. FINDINGS: Mild cardiomegaly. Tortuous thoracic aorta. Vascular congestion with small bilateral pleural effusions, left greater right. Bibasilar opacities likely atelectasis. No free intra-abdominal air. No bowel dilatation to suggest obstruction. Formed stool in the ascending and rectosigmoid colon. No abnormal rectal distention. Cholecystectomy clips in the right upper quadrant. Right hip arthroplasty is partially included. The bones are under mineralized. IMPRESSION: 1. No bowel obstruction or free air. 2. Cardiomegaly with small bilateral pleural effusions and vascular congestion, suggesting volume overload. Bibasilar opacities favor atelectasis. Electronically Signed   By: Jeb Levering M.D.   On: 09/02/2017 06:29    Subjective: Patient seen  and examined at bedside.  She feels much better and is hoping to go home today.  No overnight fever, nausea, vomiting.  Tolerating diet  Discharge Exam: Vitals:   09/03/17 1023 09/03/17 1053  BP:    Pulse:    Resp:    Temp:    SpO2: 95% 94%   Vitals:   09/03/17 0529 09/03/17 0838 09/03/17 1023 09/03/17 1053  BP: 107/64     Pulse: 77     Resp: 20     Temp: 99 F (37.2 C)     TempSrc: Oral     SpO2: 94% 94% 95% 94%  Weight:      Height:        General: Pt is alert, awake, not in acute distress Cardiovascular: Rate controlled, S1/S2 + Respiratory: Bilateral decreased breath sounds at bases Abdominal: Soft, NT, ND, bowel sounds + Extremities: no edema, no cyanosis    The results of significant diagnostics from this hospitalization (including imaging, microbiology, ancillary and laboratory) are listed below for reference.     Microbiology: No results found for this or any previous visit (from the past 240 hour(s)).   Labs: BNP (last 3 results) No results for input(s): BNP in  the last 8760 hours. Basic Metabolic Panel: Recent Labs  Lab 09/02/17 0045 09/03/17 0524  NA 138 136  K 3.5 3.9  CL 106 109  CO2 21* 19*  GLUCOSE 152* 93  BUN 12 10  CREATININE 0.62 0.55  CALCIUM 9.1 8.2*  MG  --  1.7   Liver Function Tests: Recent Labs  Lab 09/02/17 0045 09/03/17 0524  AST 21 14*  ALT 12* 8*  ALKPHOS 110 83  BILITOT 0.6 1.3*  PROT 7.6 6.1*  ALBUMIN 3.9 2.8*   Recent Labs  Lab 09/02/17 0803  LIPASE 19   No results for input(s): AMMONIA in the last 168 hours. CBC: Recent Labs  Lab 09/02/17 0045 09/03/17 0524  WBC 22.2* 14.7*  NEUTROABS 19.8* 13.3*  HGB 12.3 10.5*  HCT 38.4 33.5*  MCV 80.5 81.3  PLT 297 234   Cardiac Enzymes: Recent Labs  Lab 09/02/17 0045 09/02/17 0803  TROPONINI <0.03 <0.03   BNP: Invalid input(s): POCBNP CBG: No results for input(s): GLUCAP in the last 168 hours. D-Dimer No results for input(s): DDIMER in the last 72  hours. Hgb A1c No results for input(s): HGBA1C in the last 72 hours. Lipid Profile No results for input(s): CHOL, HDL, LDLCALC, TRIG, CHOLHDL, LDLDIRECT in the last 72 hours. Thyroid function studies No results for input(s): TSH, T4TOTAL, T3FREE, THYROIDAB in the last 72 hours.  Invalid input(s): FREET3 Anemia work up No results for input(s): VITAMINB12, FOLATE, FERRITIN, TIBC, IRON, RETICCTPCT in the last 72 hours. Urinalysis    Component Value Date/Time   COLORURINE YELLOW 09/02/2017 0257   APPEARANCEUR CLEAR 09/02/2017 0257   LABSPEC 1.018 09/02/2017 0257   PHURINE 8.0 09/02/2017 0257   GLUCOSEU NEGATIVE 09/02/2017 0257   HGBUR NEGATIVE 09/02/2017 0257   BILIRUBINUR NEGATIVE 09/02/2017 0257   KETONESUR 5 (A) 09/02/2017 0257   PROTEINUR NEGATIVE 09/02/2017 0257   NITRITE NEGATIVE 09/02/2017 0257   LEUKOCYTESUR TRACE (A) 09/02/2017 0257   Sepsis Labs Invalid input(s): PROCALCITONIN,  WBC,  LACTICIDVEN Microbiology No results found for this or any previous visit (from the past 240 hour(s)).   Time coordinating discharge: 35 minutes  SIGNED:   Aline August, MD  [facility] 09/03/2017, 12:32 PM Pager: [phone]  If 7PM-7AM, please contact night-coverage [email or web address] Password [email or web address]

## 2019-10-09 IMAGING — CT CT HEAD W/O CM
3 series · 15 of 47 positions shown, 18 images · non-contrast
Comparison: None.

CLINICAL DATA: Headache

EXAM:
CT HEAD WITHOUT CONTRAST
TECHNIQUE: Contiguous axial images were obtained from the base of the skull
through the vertex without intravenous contrast.

[Series 2: head wo · axial · 0.47mm/px · z∈[-104,+21]mm · 9 of 30 slices shown, 12 images]
[im 3/30  brain]
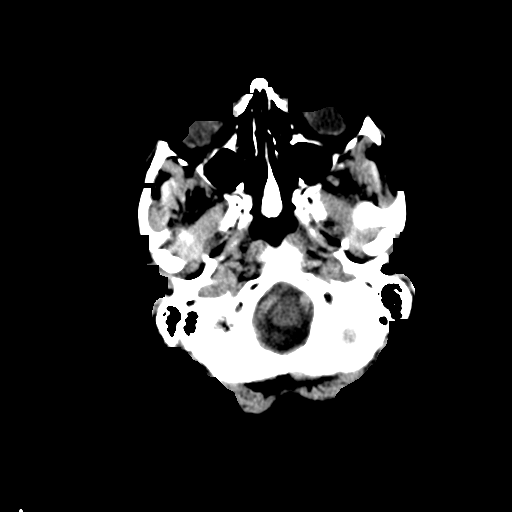
[im 3/30  bone]
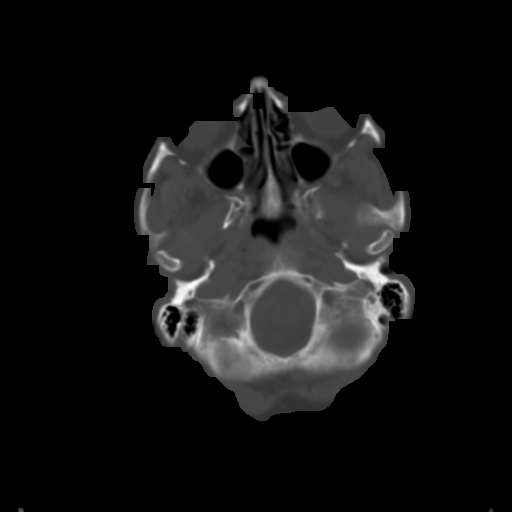
[im 6/30  brain]
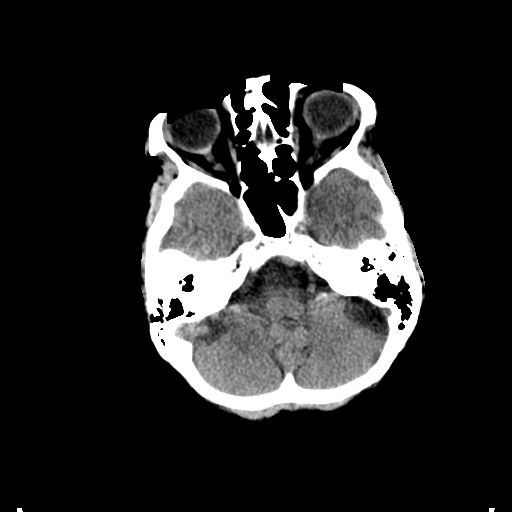
[im 9/30  brain]
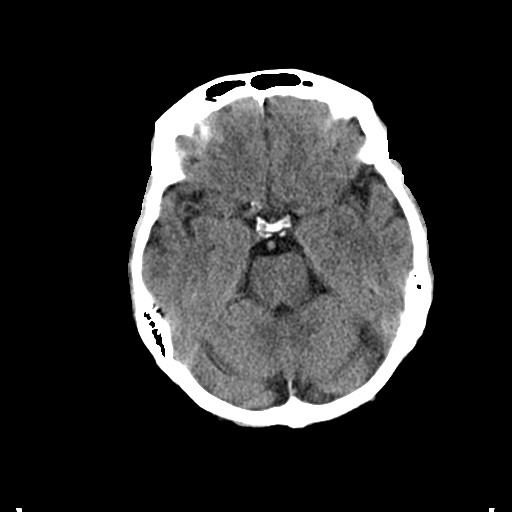
[im 12/30  brain]
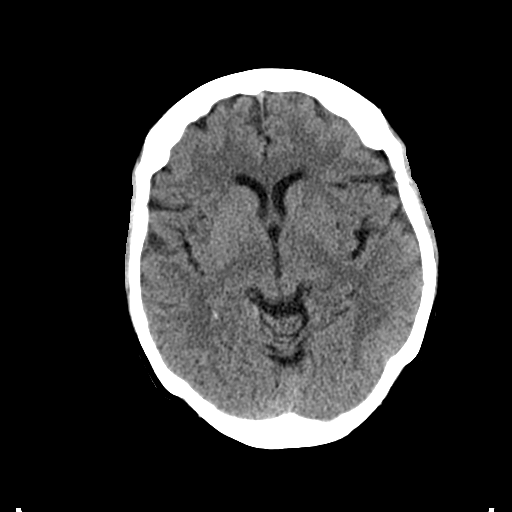
[im 16/30  brain]
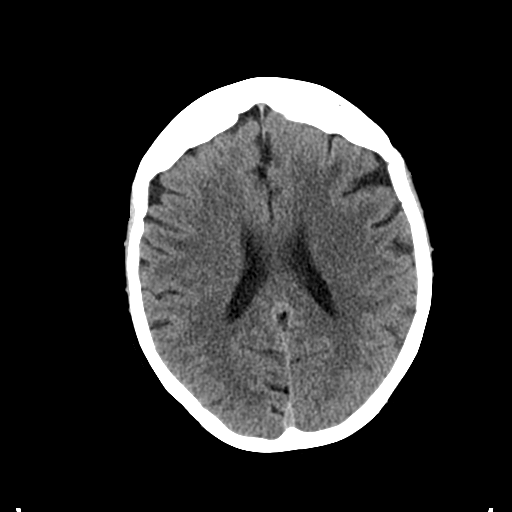
[im 16/30  bone]
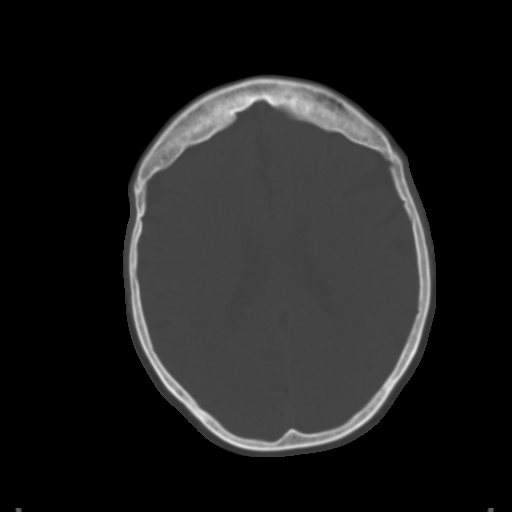
[im 19/30  brain]
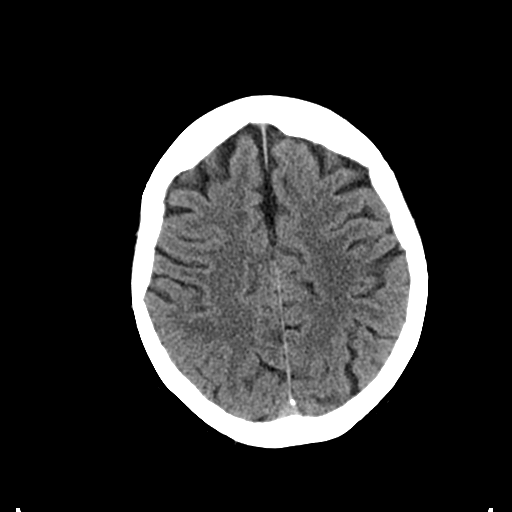
[im 22/30  brain]
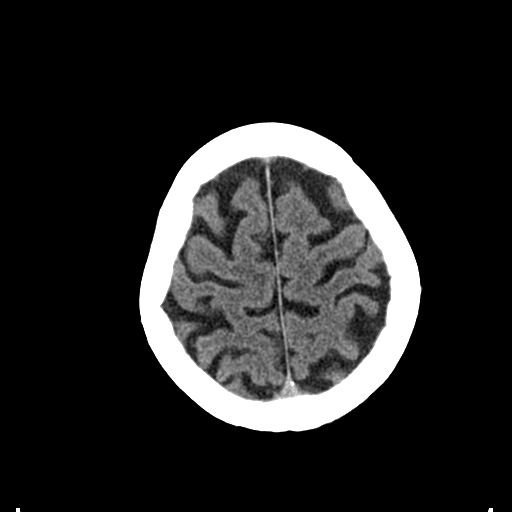
[im 25/30  brain]
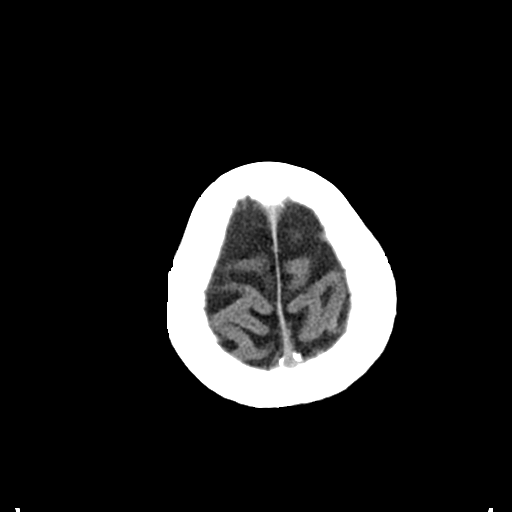
[im 28/30  brain]
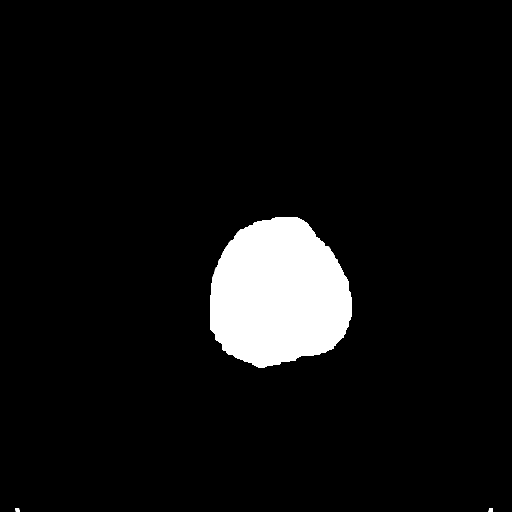
[im 28/30  bone]
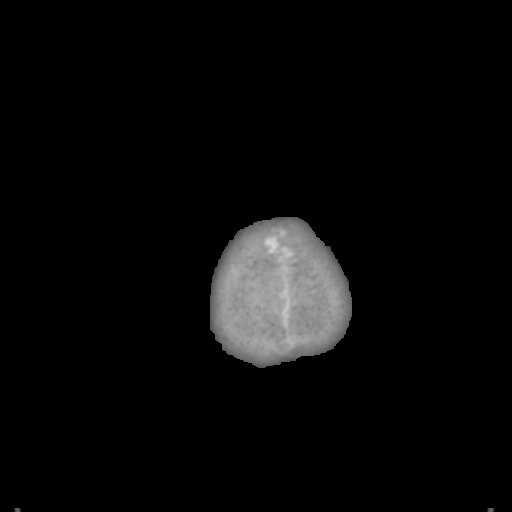

[Series 4: coronal soft tissue · coronal · 0.29mm/px · 3 of 60 slices shown]
[im 20/60  brain]
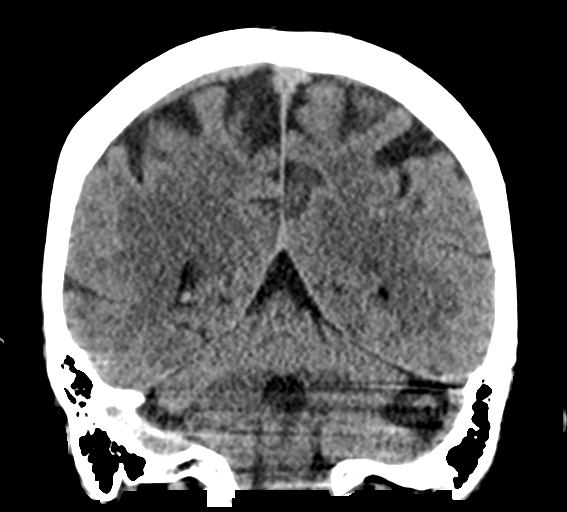
[im 27/60  brain]
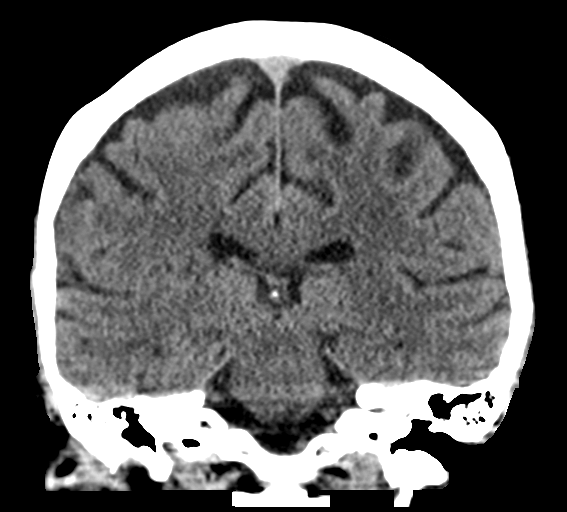
[im 33/60  brain]
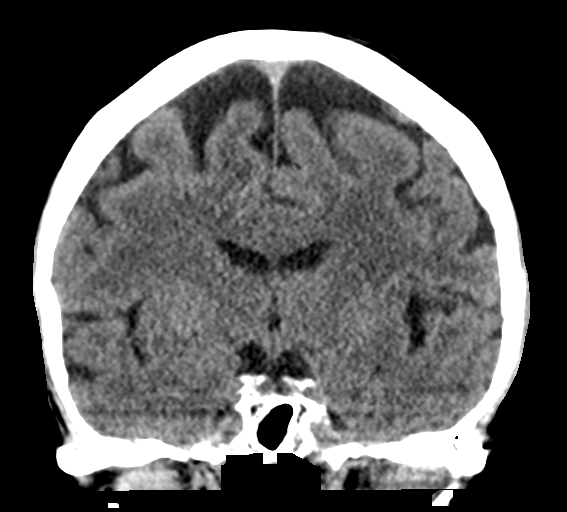

[Series 5: sagittal soft tissue · sagittal · 0.29mm/px · 3 of 49 slices shown]
[im 17/49  brain]
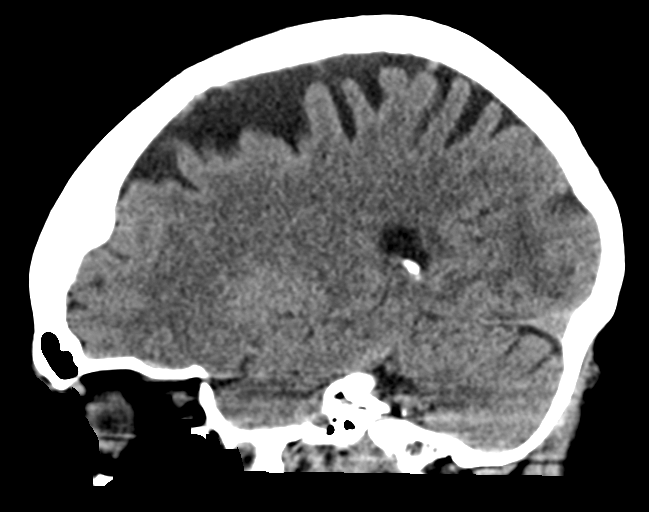
[im 25/49  brain]
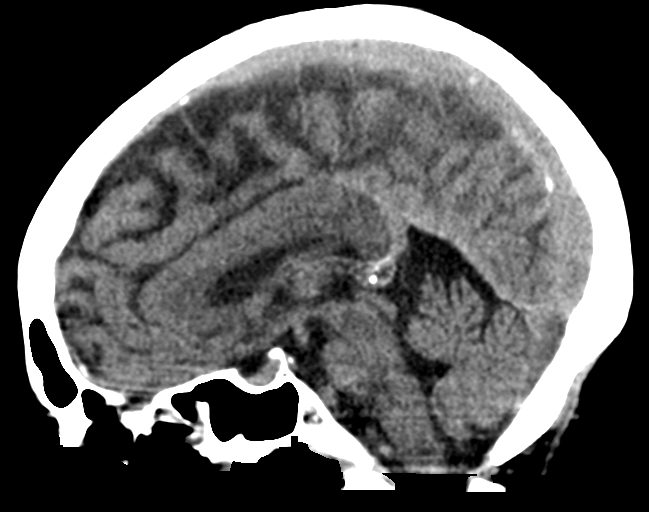
[im 33/49  brain]
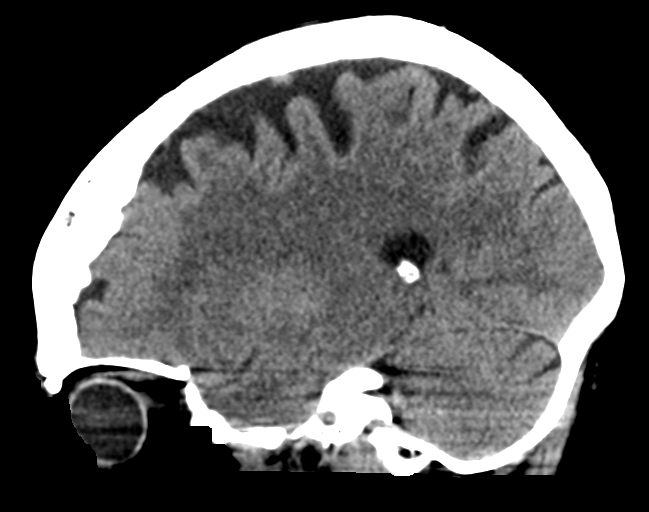

[15 of 47 positions shown; findings below may reference images not displayed]

FINDINGS: Brain: No acute territorial infarction, hemorrhage or intracranial
mass. Moderate atrophy. Minimal small vessel ischemic changes of the
white matter. Nonenlarged ventricles

Vascular: No hyperdense vessels.  Carotid vascular calcification

Skull: Normal. Negative for fracture or focal lesion.

Sinuses/Orbits: Mucosal thickening in the ethmoid sinuses. No acute
orbital abnormality.

Other: None
IMPRESSION: 1. No CT evidence for acute intracranial abnormality.
2. Atrophy and minimal small vessel ischemic changes of the white
matter
